# Patient Record
Sex: Male | Born: 2005 | Race: White | Hispanic: No | Marital: Single | Smoking: Never smoker
Health system: Southern US, Community
[De-identification: ages and names within clinical notes are randomized; demographics above are authoritative.]

## PROBLEM LIST (undated history)

## (undated) DIAGNOSIS — E669 Obesity, unspecified: Secondary | ICD-10-CM

## (undated) HISTORY — PX: TYMPANOSTOMY TUBE PLACEMENT: SHX32

## (undated) HISTORY — PX: ADENOIDECTOMY: SUR15

## (undated) HISTORY — PX: TONSILLECTOMY: SUR1361

## (undated) HISTORY — DX: Obesity, unspecified: E66.9

---
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
## 2024-01-24 ENCOUNTER — Encounter (HOSPITAL_BASED_OUTPATIENT_CLINIC_OR_DEPARTMENT_OTHER): Payer: Self-pay | Admitting: Emergency Medicine

## 2024-01-24 ENCOUNTER — Emergency Department (HOSPITAL_BASED_OUTPATIENT_CLINIC_OR_DEPARTMENT_OTHER): Admitting: Radiology

## 2024-01-24 ENCOUNTER — Emergency Department (HOSPITAL_BASED_OUTPATIENT_CLINIC_OR_DEPARTMENT_OTHER)
Admission: EM | Admit: 2024-01-24 | Discharge: 2024-01-24 | Disposition: A | Discharge status: Home / Self Care | Attending: Emergency Medicine | Admitting: Emergency Medicine

## 2024-01-24 DIAGNOSIS — R079 Chest pain, unspecified: Secondary | ICD-10-CM | POA: Insufficient documentation

## 2024-01-24 DIAGNOSIS — R0789 Other chest pain: Secondary | ICD-10-CM | POA: Diagnosis not present

## 2024-01-24 DIAGNOSIS — R000 Tachycardia, unspecified: Secondary | ICD-10-CM | POA: Diagnosis not present

## 2024-01-24 DIAGNOSIS — R002 Palpitations: Secondary | ICD-10-CM | POA: Diagnosis not present

## 2024-01-24 LAB — CBC WITH DIFFERENTIAL/PLATELET
Abs Immature Granulocytes: 0.05 10*3/uL (ref 0.00–0.07)
Basophils Absolute: 0 10*3/uL (ref 0.0–0.1)
Basophils Relative: 1 %
Eosinophils Absolute: 0.1 10*3/uL (ref 0.0–0.5)
Eosinophils Relative: 1 %
HCT: 48.3 % (ref 39.0–52.0)
Hemoglobin: 16.1 g/dL (ref 13.0–17.0)
Immature Granulocytes: 1 %
Lymphocytes Relative: 31 %
Lymphs Abs: 2.7 10*3/uL (ref 0.7–4.0)
MCH: 28.4 pg (ref 26.0–34.0)
MCHC: 33.3 g/dL (ref 30.0–36.0)
MCV: 85.3 fL (ref 80.0–100.0)
Monocytes Absolute: 0.7 10*3/uL (ref 0.1–1.0)
Monocytes Relative: 7 %
Neutro Abs: 5.3 10*3/uL (ref 1.7–7.7)
Neutrophils Relative %: 59 %
Platelets: 269 10*3/uL (ref 150–400)
RBC: 5.66 MIL/uL (ref 4.22–5.81)
RDW: 12.8 % (ref 11.5–15.5)
WBC: 8.9 10*3/uL (ref 4.0–10.5)
nRBC: 0 % (ref 0.0–0.2)

## 2024-01-24 LAB — TROPONIN T, HIGH SENSITIVITY
Troponin T High Sensitivity: <15 ng/L (ref <19)
Troponin T High Sensitivity: <15 ng/L (ref <19)

## 2024-01-24 LAB — COMPREHENSIVE METABOLIC PANEL WITH GFR
ALT: 20 U/L (ref 0–44)
AST: 21 U/L (ref 15–41)
Albumin: 4.4 g/dL (ref 3.5–5.0)
Alkaline Phosphatase: 69 U/L (ref 38–126)
Anion gap: 12 (ref 5–15)
BUN: 11 mg/dL (ref 6–20)
CO2: 25 mmol/L (ref 22–32)
Calcium: 10.1 mg/dL (ref 8.9–10.3)
Chloride: 104 mmol/L (ref 98–111)
Creatinine, Ser: 0.75 mg/dL (ref 0.61–1.24)
GFR, Estimated: >60 mL/min (ref >60)
Glucose, Bld: 83 mg/dL (ref 70–99)
Potassium: 3.9 mmol/L (ref 3.5–5.1)
Sodium: 140 mmol/L (ref 135–145)
Total Bilirubin: 0.5 mg/dL (ref 0.0–1.2)
Total Protein: 8.1 g/dL (ref 6.5–8.1)

## 2024-01-24 NOTE — ED Provider Notes (Signed)
 [place] EMERGENCY DEPARTMENT AT [facility] Provider Note   CSN: [id number] Arrival date & time: 01/24/24  1058     History  Chief Complaint  Patient presents with   Chest Pain    Samuel Jennings is a 18 y.o. male.  Patient with no significant past medical history presents to the emergency department for evaluation of chest pain and palpitations starting this morning.  Patient's mother at bedside provides additional history.  She states that he has looked tired and has been more quiet over the past 48 hours or so.  Patient was working today.  He has a strenuous job.  While working he developed an awareness of his heartbeat.  States that it was not really beating too fast.  He had some mid chest pain that resolved after a few minutes.  No lightheadedness or syncope.  No shortness of breath.  No history of arrhythmia or family history of heart problems or arrhythmia at a young age.  He does have a grandfather who has heart failure and a pacemaker, however was older when he had this.  No lower extremity swelling or history of blood clots.  No infectious symptoms including fever, URI symptoms, etc.  Patient reports normal appetite, eating and drinking well.  Incident occurred between 10 and 10:30 AM.       Home Medications Prior to Admission medications   Medication Sig Start Date End Date Taking? Authorizing Provider  cetirizine (ZYRTEC) 10 MG tablet Take 10 mg by mouth daily.    [provider]  lidocaine -prilocaine  (EMLA ) cream Apply 1 application topically as needed. 05/17/15   Lavada Porteous, MD      Allergies    Patient has no known allergies.    Review of Systems   Review of Systems  Physical Exam Updated Vital Signs BP (!) 150/96   Pulse 91   Temp 98.2 F (36.8 C) (Oral)   Resp 18   SpO2 99%  Physical Exam Vitals and nursing note reviewed.  Constitutional:      Appearance: He is well-developed. He is not diaphoretic.  HENT:     Head:  Normocephalic and atraumatic.     Mouth/Throat:     Mouth: Mucous membranes are not dry.  Eyes:     Conjunctiva/sclera: Conjunctivae normal.  Neck:     Vascular: Normal carotid pulses. No carotid bruit or JVD.     Trachea: Trachea normal. No tracheal deviation.  Cardiovascular:     Rate and Rhythm: Normal rate and regular rhythm.     Pulses: No decreased pulses.          Radial pulses are 2+ on the right side and 2+ on the left side.     Heart sounds: Normal heart sounds, S1 normal and S2 normal. Heart sounds not distant. No murmur heard. Pulmonary:     Effort: Pulmonary effort is normal. No respiratory distress.     Breath sounds: Normal breath sounds. No wheezing.  Chest:     Chest wall: No tenderness.  Abdominal:     General: Bowel sounds are normal.     Palpations: Abdomen is soft.     Tenderness: There is no abdominal tenderness. There is no guarding or rebound.  Musculoskeletal:     Cervical back: Normal range of motion and neck supple. No muscular tenderness.     Right lower leg: No edema.     Left lower leg: No edema.  Skin:    General: Skin is warm and  dry.     Coloration: Skin is not pale.  Neurological:     Mental Status: He is alert. Mental status is at baseline.  Psychiatric:        Mood and Affect: Mood normal.     ED Results / Procedures / Treatments   Labs (all labs ordered are listed, but only abnormal results are displayed) Labs Reviewed  CBC WITH DIFFERENTIAL/PLATELET  COMPREHENSIVE METABOLIC PANEL WITH GFR  TROPONIN T, HIGH SENSITIVITY  TROPONIN T, HIGH SENSITIVITY    EKG EKG Interpretation Date/Time:  Sunday Jan 24 2024 11:50:10 EDT Ventricular Rate:  74 PR Interval:  128 QRS Duration:  109 QT Interval:  369 QTC Calculation: 410 R Axis:   104  Text Interpretation: Sinus rhythm Borderline right axis deviation Confirmed by Lind Repine ([id number]) on 01/24/2024 1:12:27 PM  Radiology DG Chest 2 View Result Date: 01/24/2024 CLINICAL DATA:   Chest pain and palpitations while walking today. EXAM: CHEST - 2 VIEW COMPARISON:  06/26/2014 FINDINGS: Lungs are adequately inflated without airspace consolidation or effusion. No pneumothorax. Cardiomediastinal silhouette and remainder the exam is unchanged. IMPRESSION: No active cardiopulmonary disease. Electronically Signed   By: Roda Cirri M.D.   On: 01/24/2024 12:41    Procedures Procedures    Medications Ordered in ED Medications - No data to display  ED Course/ Medical Decision Making/ A&P    Patient seen and examined. History obtained directly from patient and parent at bedside.  Labs/EKG: Ordered CBC, CMP, troponin, EKG.  Imaging: Ordered chest x-ray.  Medications/Fluids: None ordered  Most recent vital signs reviewed and are as follows: BP (!) 150/96   Pulse 91   Temp 98.2 F (36.8 C) (Oral)   Resp 18   SpO2 99%   Initial impression: Fatigue, episode of chest pain/palpitations, resolved.  4:30 PM Reassessment performed. Patient appears stable.  Labs personally reviewed and interpreted including: CBC unremarkable; CMP unremarkable; troponin normal x 2.  Imaging personally visualized and interpreted including: Chest x-ray, agree negative, normal heart size.  Reviewed pertinent lab work and imaging with patient at bedside. Questions answered.   Most current vital signs reviewed and are as follows: BP 129/67   Pulse 68   Temp 98.2 F (36.8 C)   Resp 13   SpO2 100%   Plan: Discharge to home.   Prescriptions written for: None  Other home care instructions discussed: Rest, hydration  ED return instructions discussed: Worsening chest pain, shortness of breath, new or worsening symptoms  Follow-up instructions discussed: Patient encouraged to follow-up with their PCP in 5 days.                                   Medical Decision Making Amount and/or Complexity of Data Reviewed Labs: ordered. Radiology: ordered.   Patient with some generalized  fatigue over the past several days, has been working hard at his job.  Short-lived episode of chest pain with palpitations today.  EKG here was reassuring without signs of heart block, prolonged QT, WPW, Brugada syndrome, heart reports reviewed.  Cardiac workup negative with troponins negative x 2.  No concern for pericarditis, myocarditis given lack of other infectious symptoms and persistent symptoms.  No lightheadedness or near syncope with this episode.  At this time patient cleared for discharge.  Encouraged outpatient follow-up.  The patient's vital signs, pertinent lab work and imaging were reviewed and interpreted as discussed in the ED course. Hospitalization  was considered for further testing, treatments, or serial exams/observation. However as patient is well-appearing, has a stable exam, and reassuring studies today, I do not feel that they warrant admission at this time. This plan was discussed with the patient who verbalizes agreement and comfort with this plan and seems reliable and able to return to the Emergency Department with worsening or changing symptoms.           Final Clinical Impression(s) / ED Diagnoses Final diagnoses:  Chest pain, unspecified type  Palpitations    Rx / DC Orders ED Discharge Orders     None         Lyna Sandhoff, PA-C 01/24/24 1641    Lind Repine, MD 01/25/24 1226

## 2024-01-24 NOTE — Discharge Instructions (Addendum)
 Please read and follow all provided instructions.  Your diagnoses today include:  1. Chest pain, unspecified type   2. Palpitations     Tests performed today include: An EKG of your heart A chest x-ray Cardiac enzymes - a blood test for heart muscle damage, were both normal Blood counts and electrolytes Vital signs. See below for your results today.   Medications prescribed:  None  Take any prescribed medications only as directed.  Follow-up instructions: Please follow-up with your primary care provider as soon as you can for further evaluation of your symptoms.   Return instructions:  SEEK IMMEDIATE MEDICAL ATTENTION IF: You have severe chest pain, especially if the pain is crushing or pressure-like and spreads to the arms, back, neck, or jaw, or if you have sweating, nausea or vomiting, or trouble with breathing. THIS IS AN EMERGENCY. Do not wait to see if the pain will go away. Get medical help at once. Call 911. DO NOT drive yourself to the hospital.  Your chest pain gets worse and does not go away after a few minutes of rest.  You have an attack of chest pain lasting longer than what you usually experience.  You have significant dizziness, if you pass out, or have trouble walking.  You have chest pain not typical of your usual pain for which you originally saw your caregiver.  You have any other emergent concerns regarding your health.  Additional Information: Chest pain comes from many different causes. Your caregiver has diagnosed you as having chest pain that is not specific for one problem, but does not require admission.  You are at low risk for an acute heart condition or other serious illness.   Your vital signs today were: BP 128/77   Pulse 74   Temp 98.2 F (36.8 C) (Oral)   Resp 12   SpO2 100%  If your blood pressure (BP) was elevated above 135/85 this visit, please have this repeated by your doctor within one month. --------------

## 2024-01-24 NOTE — ED Triage Notes (Signed)
 Chest pain/ palps Today While working had palps and sudden chest pain Lasted about 10-15 mins No pain now
